# Patient Record
Sex: Female | Born: 1980 | Race: White | Hispanic: Yes | Marital: Married
Health system: Southern US, Community
[De-identification: ages and names within clinical notes are randomized; demographics above are authoritative.]

---

## 2021-05-26 ENCOUNTER — Emergency Department (HOSPITAL_BASED_OUTPATIENT_CLINIC_OR_DEPARTMENT_OTHER): Payer: Self-pay

## 2021-05-26 ENCOUNTER — Other Ambulatory Visit: Payer: Self-pay

## 2021-05-26 ENCOUNTER — Emergency Department (HOSPITAL_BASED_OUTPATIENT_CLINIC_OR_DEPARTMENT_OTHER)
Admission: EM | Admit: 2021-05-26 | Discharge: 2021-05-26 | Disposition: A | Discharge status: Home / Self Care | Payer: Self-pay | Attending: Emergency Medicine | Admitting: Emergency Medicine

## 2021-05-26 DIAGNOSIS — G894 Chronic pain syndrome: Secondary | ICD-10-CM | POA: Insufficient documentation

## 2021-05-26 DIAGNOSIS — R0602 Shortness of breath: Secondary | ICD-10-CM | POA: Insufficient documentation

## 2021-05-26 DIAGNOSIS — R051 Acute cough: Secondary | ICD-10-CM | POA: Insufficient documentation

## 2021-05-26 NOTE — ED Triage Notes (Signed)
Pt arrives with multiple complaints. shob, weakness, generalized body aches x 2 days. Also c/o neck, right arm, and leg numbness x months. Also reports abdominal pain due to constipation. LBM 1 week ago.  ?

## 2021-05-26 NOTE — ED Provider Notes (Signed)
? ?[facility] EMERGENCY DEPARTMENT  ?Provider Note ? ?CSN: [id number] ?Arrival date & time: 05/26/21 0012 ? ?History ?Chief Complaint  ?Patient presents with  ? Multiple Complaints  ? ?History with video interpreter  ? ?Alexa Avery is a 41 y.o. female with several chronic problems such as PTSD, ESD, fibromyalgia and chronic pain is here for evaluation of pain on the whole right side of her body ongoing for several months. She also reports feeling SOB earlier this evening and she has not had a BM in a week. She has numerous visits in the [place] system and it isn't clear why she decided to come to this ED today. She has a history of MVP and [place] 1 block, followed by Cardiology with [facility]. After clarifying her symptoms it seems her only real acute complaints is her SOB which she reports started three days ago after she was discharged from an admission to behavioral health for depression and SI.  ? ? ?Home Medications ?Prior to Admission medications   ?Not on File  ? ? ? ?Allergies    ?Penicillins ? ? ?Review of Systems   ?Review of Systems ?Please see HPI for pertinent positives and negatives ? ?Physical Exam ?BP (!) 120/52 (BP Location: Right Arm)   Pulse 68   Temp 97.7 ?F (36.5 ?C) (Oral)   Resp 18   SpO2 100%  ? ?Physical Exam ?Vitals and nursing note reviewed.  ?Constitutional:   ?   Appearance: Normal appearance.  ?HENT:  ?   Head: Normocephalic and atraumatic.  ?   Nose: Nose normal.  ?   Mouth/Throat:  ?   Mouth: Mucous membranes are moist.  ?Eyes:  ?   Extraocular Movements: Extraocular movements intact.  ?   Conjunctiva/sclera: Conjunctivae normal.  ?Cardiovascular:  ?   Rate and Rhythm: Normal rate.  ?Pulmonary:  ?   Effort: Pulmonary effort is normal.  ?   Breath sounds: Normal breath sounds.  ?Abdominal:  ?   General: Abdomen is flat.  ?   Palpations: Abdomen is soft.  ?   Tenderness: There is no abdominal tenderness.  ?Musculoskeletal:     ?   General: Tenderness (diffuse MSK)  present. No swelling. Normal range of motion.  ?   Cervical back: Neck supple.  ?Skin: ?   General: Skin is warm and dry.  ?Neurological:  ?   General: No focal deficit present.  ?   Mental Status: She is alert.  ?Psychiatric:     ?   Mood and Affect: Mood normal.  ? ? ?ED Results / Procedures / Treatments   ?EKG ?EKG Interpretation ? ?Date/Time:  Saturday May 26 2021 00:35:02 EDT ?Ventricular Rate:  64 ?PR Interval:  198 ?QRS Duration: 106 ?QT Interval:  439 ?QTC Calculation: 453 ?R Axis:   82 ?Text Interpretation: Sinus rhythm Low voltage, precordial leads No old tracing to compare Confirmed by Susy Frizzle [phone]) on 05/26/2021 2:29:04 AM ? ?Procedures ?Procedures ? ?Medications Ordered in the ED ?Medications - No data to display ? ?Initial Impression and Plan ? Patient with numerous chronic complaints. Advised her and friend at bedside that we cannot manage those problems from this ED. She is having SOB which she describes as more acute though. She has a normal exam and no objective signs of dyspnea. EKG without concerning findings. Offered CXR to screen for emergent condition and she is amenable.  ? ?ED Course  ? ?Clinical Course as of 05/26/21 0327  ?Sat May 26, 2021  ?  [id number] I personally viewed the images from radiology studies and agree with radiologist interpretation: CXR is clear.  ? [CS]  ?[id number] As discussed above, patient has sevral chronic complaints not in need of emergent evaluation. CXR is clear. Vitals have been normal. Recommend PCP follow up for long term management.  [CS]  ?  ?Clinical Course User Index ?[CS] Pollyann Savoy, MD  ? ? ? ?MDM Rules/Calculators/A&P ?Medical Decision Making ?Problems Addressed: ?Chronic pain syndrome: chronic illness or injury ?Shortness of breath: acute illness or injury ? ?Amount and/or Complexity of Data Reviewed ?External Data Reviewed: labs, radiology and notes. ?   Details: From [facility] ?Radiology: ordered and independent interpretation performed.  Decision-making details documented in ED Course. ?ECG/medicine tests: ordered and independent interpretation performed. Decision-making details documented in ED Course. ? ? ? ?Final Clinical Impression(s) / ED Diagnoses ?Final diagnoses:  ?Chronic pain syndrome  ?Shortness of breath  ? ? ?Rx / DC Orders ?ED Discharge Orders   ? ? None  ? ?  ? ?  ?Pollyann Savoy, MD ?05/26/21 [phone] ? ?

## 2021-10-05 ENCOUNTER — Ambulatory Visit (HOSPITAL_BASED_OUTPATIENT_CLINIC_OR_DEPARTMENT_OTHER): Payer: Self-pay | Admitting: Physical Therapy

## 2021-10-09 ENCOUNTER — Ambulatory Visit (HOSPITAL_BASED_OUTPATIENT_CLINIC_OR_DEPARTMENT_OTHER): Payer: Self-pay | Admitting: Physical Therapy

## 2021-10-12 ENCOUNTER — Ambulatory Visit (HOSPITAL_BASED_OUTPATIENT_CLINIC_OR_DEPARTMENT_OTHER): Payer: Self-pay | Admitting: Physical Therapy

## 2021-10-16 ENCOUNTER — Ambulatory Visit (HOSPITAL_BASED_OUTPATIENT_CLINIC_OR_DEPARTMENT_OTHER): Payer: Self-pay | Admitting: Physical Therapy

## 2021-10-19 ENCOUNTER — Ambulatory Visit (HOSPITAL_BASED_OUTPATIENT_CLINIC_OR_DEPARTMENT_OTHER): Payer: Self-pay | Admitting: Physical Therapy

## 2021-10-23 ENCOUNTER — Ambulatory Visit (HOSPITAL_BASED_OUTPATIENT_CLINIC_OR_DEPARTMENT_OTHER): Payer: Self-pay | Admitting: Physical Therapy

## 2021-10-25 ENCOUNTER — Ambulatory Visit (HOSPITAL_BASED_OUTPATIENT_CLINIC_OR_DEPARTMENT_OTHER): Payer: Self-pay | Admitting: Physical Therapy

## 2022-05-18 IMAGING — DX DG CHEST 2V
2 series · 2 of 2 positions shown · non-contrast
Comparison: None Available.

CLINICAL DATA: Shortness of breath.

EXAM:
CHEST - 2 VIEW

[chest pa]
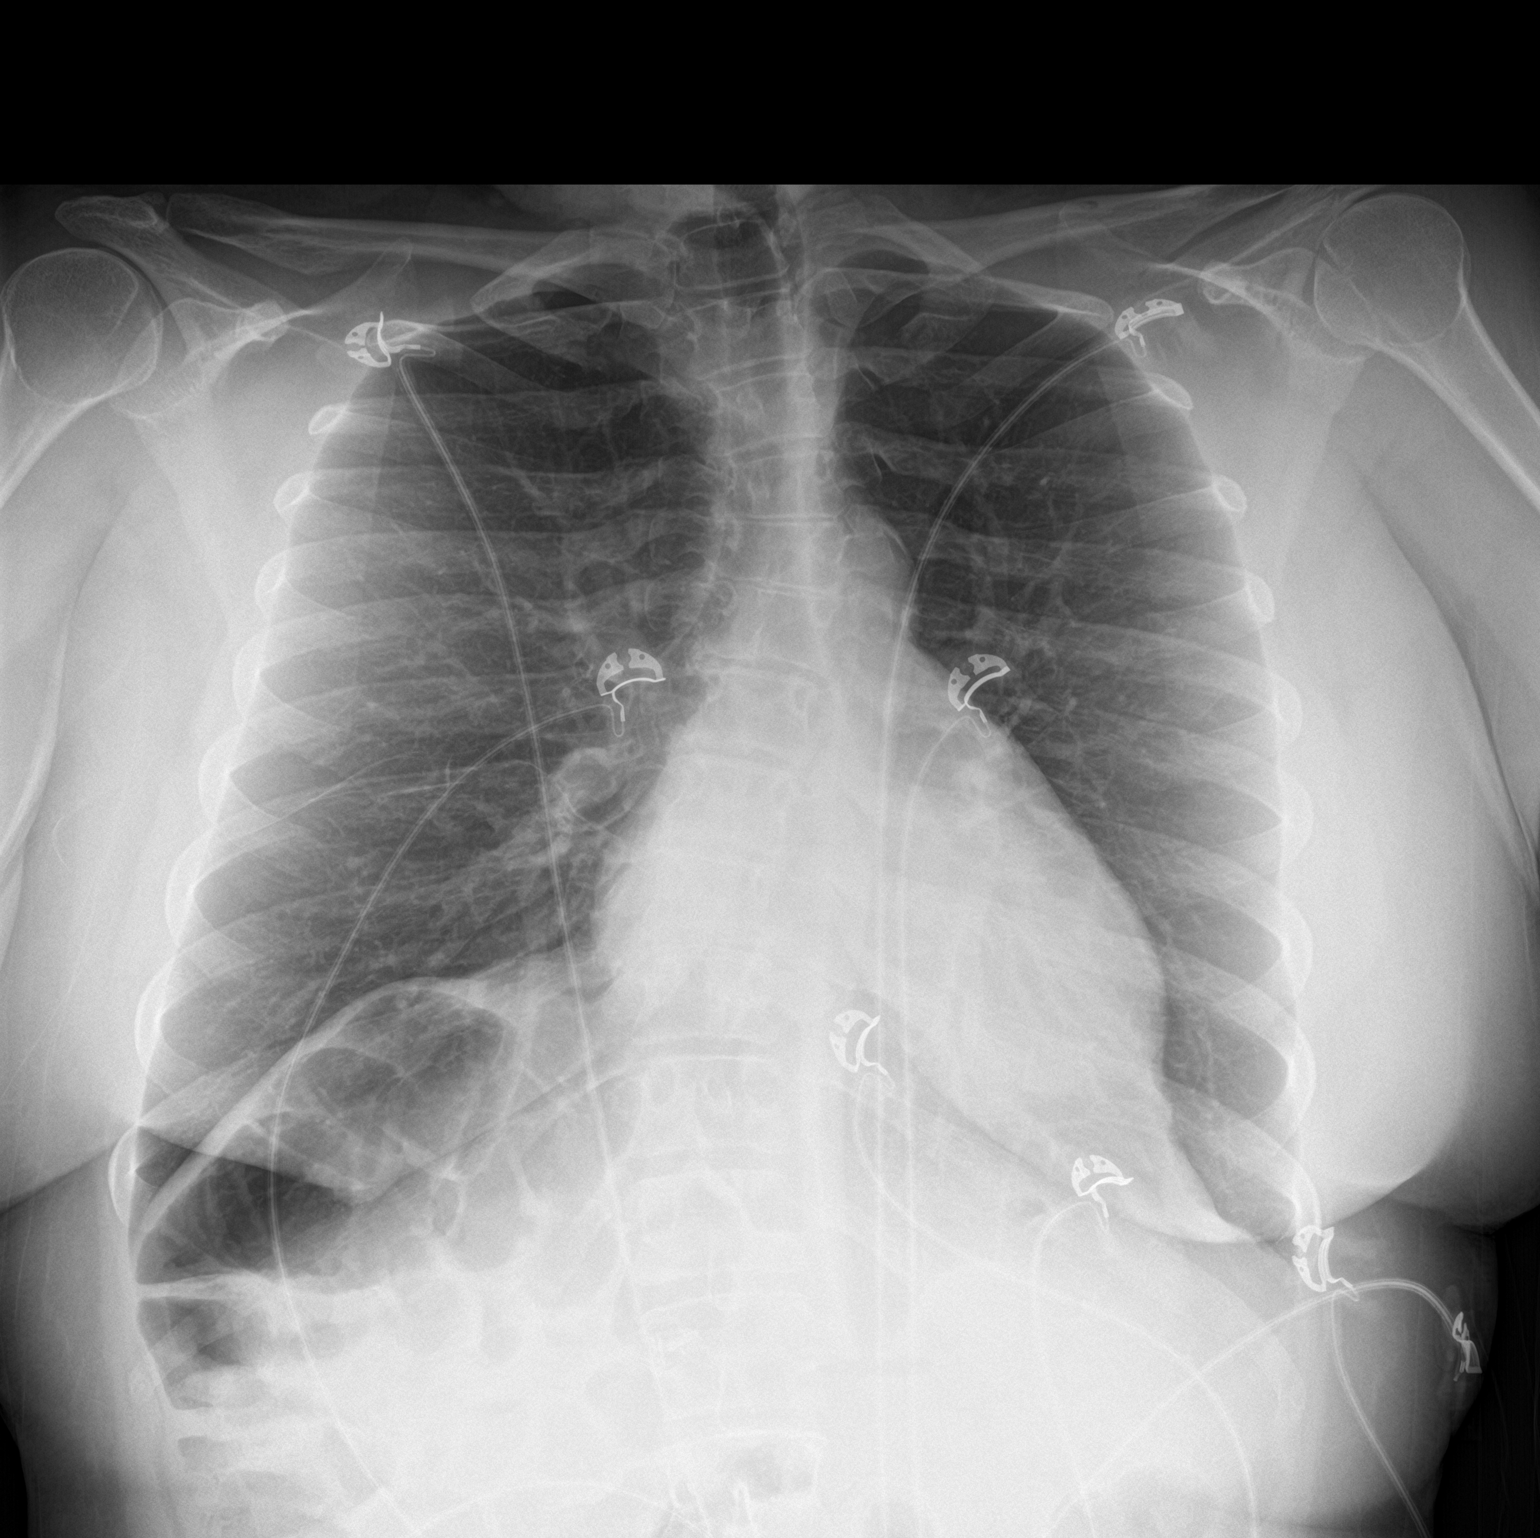

[chest lat]
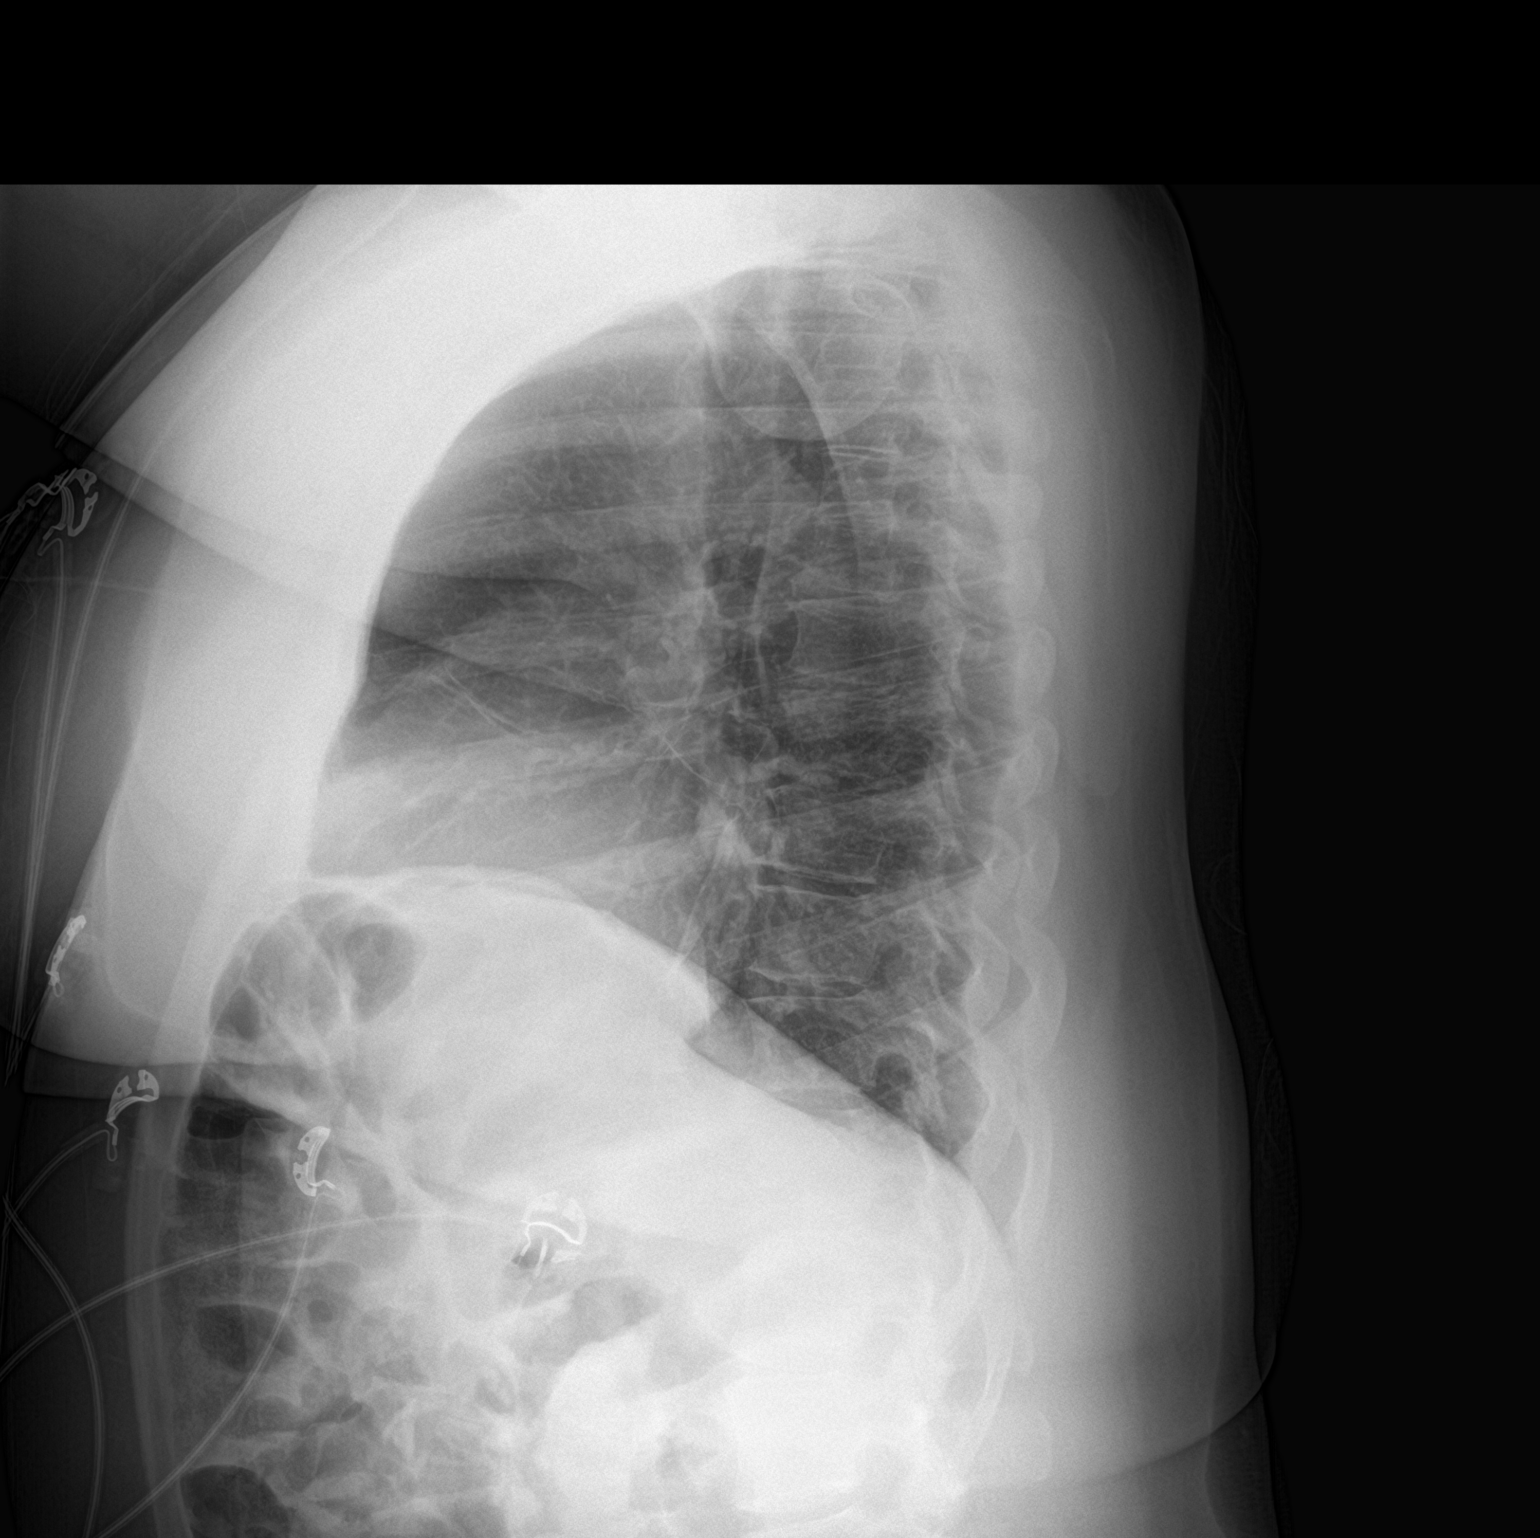

[2 of 2 positions shown; findings below may reference images not displayed]

FINDINGS: No focal consolidation, pleural effusion or pneumothorax. The
cardiac silhouette is within normal limits. Atherosclerotic
calcification of the aortic arch. No acute osseous pathology.
IMPRESSION: No active cardiopulmonary disease.
